# Patient Record
Sex: Male | Born: 1985 | Race: White | Hispanic: No | Marital: Married
Health system: Southern US, Community
[De-identification: ages and names within clinical notes are randomized; demographics above are authoritative.]

---

## 2019-06-06 ENCOUNTER — Emergency Department (HOSPITAL_COMMUNITY): Payer: Managed Care, Other (non HMO)

## 2019-06-06 ENCOUNTER — Other Ambulatory Visit: Payer: Self-pay

## 2019-06-06 ENCOUNTER — Emergency Department (HOSPITAL_COMMUNITY)
Admission: EM | Admit: 2019-06-06 | Discharge: 2019-06-06 | Disposition: A | Discharge status: Home / Self Care | Payer: Managed Care, Other (non HMO) | Attending: Emergency Medicine | Admitting: Emergency Medicine

## 2019-06-06 ENCOUNTER — Encounter (HOSPITAL_COMMUNITY): Payer: Self-pay | Admitting: Emergency Medicine

## 2019-06-06 DIAGNOSIS — Y9389 Activity, other specified: Secondary | ICD-10-CM | POA: Diagnosis not present

## 2019-06-06 DIAGNOSIS — R0989 Other specified symptoms and signs involving the circulatory and respiratory systems: Secondary | ICD-10-CM

## 2019-06-06 DIAGNOSIS — Z20822 Contact with and (suspected) exposure to covid-19: Secondary | ICD-10-CM | POA: Diagnosis not present

## 2019-06-06 DIAGNOSIS — Y999 Unspecified external cause status: Secondary | ICD-10-CM | POA: Diagnosis not present

## 2019-06-06 DIAGNOSIS — X58XXXA Exposure to other specified factors, initial encounter: Secondary | ICD-10-CM | POA: Diagnosis not present

## 2019-06-06 DIAGNOSIS — Y929 Unspecified place or not applicable: Secondary | ICD-10-CM | POA: Diagnosis not present

## 2019-06-06 DIAGNOSIS — S1011XA Abrasion of throat, initial encounter: Secondary | ICD-10-CM | POA: Diagnosis not present

## 2019-06-06 LAB — RESPIRATORY PANEL BY RT PCR (FLU A&B, COVID)
Influenza A by PCR: NEGATIVE
Influenza B by PCR: NEGATIVE
SARS Coronavirus 2 by RT PCR: NEGATIVE

## 2019-06-06 NOTE — Procedures (Signed)
Preop diagnosis: Foreign body sensation in throat Postop diagnosis: same Procedure: Transnasal fiberoptic laryngoscopy Surgeon: Jenne Pane Anesth: Topical with 4% lidocaine Compl: None Findings: No foreign body seen in pharynx or larynx.  Anatomy unremarkable. Description:  After discussing risks, benefits, and alternatives, the patient was placed in a seated position and the right nasal passage was sprayed with topical anesthetic.  The fiberoptic scope was passed through the right nasal passage to view the pharynx and larynx.  Findings are noted above.  The scope was then removed and he was returned to nursing care in stable condition.

## 2019-06-06 NOTE — ED Triage Notes (Signed)
Pt reports possible chicken bone stuck in throat since yesterday afternoon.

## 2019-06-06 NOTE — ED Provider Notes (Signed)
[facility] EMERGENCY DEPARTMENT Provider Note   CSN: [id number] Arrival date & time: 06/06/19  1211     History Chief Complaint  Patient presents with  . food stuck in throat    Ray Hunter is a 34 y.o. male.  HPI Patient had been eating some chicken approximately 24 hours ago.  He reports he felt a very sharp small foreign body get stuck in his throat.  He thinks it is a very thin piece of bone.  He perceives it up fairly high in the throat.  He is not having any difficulty swallowing liquids or food.  He has been able to eat and drink.  Pain was worse today and thus he became concerned that it was still lodged.  No vomiting.  No difficulty breathing.  He is otherwise been well.    History reviewed. No pertinent past medical history.  There are no problems to display for this patient.   History reviewed. No pertinent surgical history.     No family history on file.  Social History   Tobacco Use  . Smoking status: Not on file  Substance Use Topics  . Alcohol use: Not on file  . Drug use: Not on file    Home Medications Prior to Admission medications   Not on File    Allergies    Patient has no known allergies.  Review of Systems   Review of Systems 10 Systems reviewed and are negative for acute change except as noted in the HPI.  Physical Exam Updated Vital Signs BP 139/80   Pulse (!) 54   Temp 97.7 F (36.5 C) (Oral)   Resp 16   Ht 6' (1.829 m)   Wt 81.6 kg   SpO2 100%   BMI 24.41 kg/m   Physical Exam Constitutional:      Appearance: Normal appearance.  HENT:     Head: Normocephalic and atraumatic.     Nose: Nose normal.     Mouth/Throat:     Mouth: Mucous membranes are moist.     Pharynx: Oropharynx is clear.     Comments: Posterior oropharynx, mild appearance of asymmetric edema in the posterior aspect of the oropharynx.  No visible foreign body.  Airway is widely patent.  Palate, tonsils and uvula are symmetric. Eyes:     Extraocular Movements: Extraocular movements intact.  Cardiovascular:     Rate and Rhythm: Normal rate and regular rhythm.  Pulmonary:     Effort: Pulmonary effort is normal.     Breath sounds: Normal breath sounds.  Musculoskeletal:        General: Normal range of motion.     Cervical back: Neck supple.  Lymphadenopathy:     Cervical: No cervical adenopathy.  Skin:    General: Skin is warm and dry.  Neurological:     General: No focal deficit present.     Mental Status: He is alert and oriented to person, place, and time.     Coordination: Coordination normal.  Psychiatric:        Mood and Affect: Mood normal.     ED Results / Procedures / Treatments   Labs (all labs ordered are listed, but only abnormal results are displayed) Labs Reviewed  RESPIRATORY PANEL BY RT PCR (FLU A&B, COVID)    EKG None  Radiology DG Neck Soft Tissue  Result Date: 06/06/2019 CLINICAL DATA:  Chicken bone in throat. Patient reports he swallowed a chicken bone yesterday, neck discomfort. EXAM: NECK SOFT  TISSUES - 1+ VIEW COMPARISON:  None. FINDINGS: There is no evidence of retropharyngeal soft tissue swelling or epiglottic enlargement. The cervical airway is unremarkable. No pneumomediastinum. Cricoid cartilage is unremarkable. No radio-opaque foreign body identified. IMPRESSION: Negative soft tissue neck radiographs. No radiopaque foreign body or pneumomediastinum. Electronically Signed   By: Keith Rake M.D.   On: 06/06/2019 16:25   DG Chest 2 View  Result Date: 06/06/2019 CLINICAL DATA:  Chest pain.  Chicken bone stuck in throat. EXAM: CHEST - 2 VIEW COMPARISON:  None. FINDINGS: The heart size and mediastinal contours are within normal limits. No evidence of pneumomediastinum or pneumothorax. Both lungs are clear. No radiopaque foreign body identified. The visualized skeletal structures are unremarkable. IMPRESSION: Negative.  No active cardiopulmonary disease. Electronically Signed   By: Marlaine Hind M.D.   On: 06/06/2019 12:59    Procedures Procedures (including critical care time)  Medications Ordered in ED Medications - No data to display  ED Course  I have reviewed the triage vital signs and the nursing notes.  Pertinent labs & imaging results that were available during my care of the patient were reviewed by me and considered in my medical decision making (see chart for details).  Clinical Course as of Jun 07 31  Tue Jun 06, 2019  1624 Consult: Reviewed with Dr. Redmond Baseman.  He will scope at bedside.   [MP]    Clinical Course User Index [MP] Charlesetta Shanks, MD   MDM Rules/Calculators/A&P                      Patient presents as outlined.  No difficulty handling secretions.  No difficulty swallowing or breathing.  He was examined by Dr. Redmond Baseman and no foreign body identified.  Symptomatic treatment with throat lozenges and gargles.  Return precautions reviewed. Final Clinical Impression(s) / ED Diagnoses Final diagnoses:  Abrasion of throat, initial encounter  Foreign body sensation in throat    Rx / DC Orders ED Discharge Orders    None       Charlesetta Shanks, MD 06/07/19 [phone]

## 2019-06-06 NOTE — Consult Note (Signed)
Reason for Consult: Throat irritation Referring Physician: ER  Ray Hunter is an 34 y.o. male.  HPI: 34 year old male was eating chicken yesterday at lunch and felt a piece of bone go down his throat.  He kept a foreign body sensation through the rest of the day but was able to eat and drink without difficulty.  There was no blood.  Today, the foreign body sensation is more intense with a feeling of swelling so he came to the ER.  History reviewed. No pertinent past medical history.  History reviewed. No pertinent surgical history.  No family history on file.  Social History:  has no history on file for tobacco, alcohol, and drug.  Allergies: No Known Allergies  Medications: I have reviewed the patient's current medications.  Results for orders placed or performed during the hospital encounter of 06/06/19 (from the past 48 hour(s))  Respiratory Panel by RT PCR (Flu A&B, Covid) - Nasopharyngeal Swab     Status: None   Collection Time: 06/06/19  4:27 PM   Specimen: Nasopharyngeal Swab  Result Value Ref Range   SARS Coronavirus 2 by RT PCR NEGATIVE NEGATIVE    Comment: (NOTE) SARS-CoV-2 target nucleic acids are NOT DETECTED. The SARS-CoV-2 RNA is generally detectable in upper respiratoy specimens during the acute phase of infection. The lowest concentration of SARS-CoV-2 viral copies this assay can detect is 131 copies/mL. A negative result does not preclude SARS-Cov-2 infection and should not be used as the sole basis for treatment or other patient management decisions. A negative result may occur with  improper specimen collection/handling, submission of specimen other than nasopharyngeal swab, presence of viral mutation(s) within the areas targeted by this assay, and inadequate number of viral copies (<131 copies/mL). A negative result must be combined with clinical observations, patient history, and epidemiological information. The expected result is Negative. Fact Sheet for  Patients:  https://www.moore.com/ Fact Sheet for Healthcare Providers:  https://www.young.biz/ This test is not yet ap proved or cleared by the [place] FDA and  has been authorized for detection and/or diagnosis of SARS-CoV-2 by FDA under an Emergency Use Authorization (EUA). This EUA will remain  in effect (meaning this test can be used) for the duration of the COVID-19 declaration under Section 564(b)(1) of the Act, 21 U.S.C. section 360bbb-3(b)(1), unless the authorization is terminated or revoked sooner.    Influenza A by PCR NEGATIVE NEGATIVE   Influenza B by PCR NEGATIVE NEGATIVE    Comment: (NOTE) The Xpert Xpress SARS-CoV-2/FLU/RSV assay is intended as an aid in  the diagnosis of influenza from Nasopharyngeal swab specimens and  should not be used as a sole basis for treatment. Nasal washings and  aspirates are unacceptable for Xpert Xpress SARS-CoV-2/FLU/RSV  testing. Fact Sheet for Patients: https://www.moore.com/ Fact Sheet for Healthcare Providers: https://www.young.biz/ This test is not yet approved or cleared by the [place] FDA and  has been authorized for detection and/or diagnosis of SARS-CoV-2 by  FDA under an Emergency Use Authorization (EUA). This EUA will remain  in effect (meaning this test can be used) for the duration of the  Covid-19 declaration under Section 564(b)(1) of the Act, 21  U.S.C. section 360bbb-3(b)(1), unless the authorization is  terminated or revoked. Performed at [facility], [address], [place], [place] [address]     DG Neck Soft Tissue  Result Date: 06/06/2019 CLINICAL DATA:  Chicken bone in throat. Patient reports he swallowed a chicken bone yesterday, neck discomfort. EXAM: NECK SOFT  TISSUES - 1+ VIEW COMPARISON:  None. FINDINGS: There is no evidence of retropharyngeal soft tissue swelling or epiglottic enlargement. The cervical airway is  unremarkable. No pneumomediastinum. Cricoid cartilage is unremarkable. No radio-opaque foreign body identified. IMPRESSION: Negative soft tissue neck radiographs. No radiopaque foreign body or pneumomediastinum. Electronically Signed   By: Keith Rake M.D.   On: 06/06/2019 16:25   DG Chest 2 View  Result Date: 06/06/2019 CLINICAL DATA:  Chest pain.  Chicken bone stuck in throat. EXAM: CHEST - 2 VIEW COMPARISON:  None. FINDINGS: The heart size and mediastinal contours are within normal limits. No evidence of pneumomediastinum or pneumothorax. Both lungs are clear. No radiopaque foreign body identified. The visualized skeletal structures are unremarkable. IMPRESSION: Negative.  No active cardiopulmonary disease. Electronically Signed   By: Marlaine Hind M.D.   On: 06/06/2019 12:59    Review of Systems  HENT: Positive for sore throat.   All other systems reviewed and are negative.  Blood pressure 139/80, pulse (!) 54, temperature 97.7 F (36.5 C), temperature source Oral, resp. rate 16, height 6' (1.829 m), weight 81.6 kg, SpO2 100 %. Physical Exam  Constitutional: He is oriented to person, place, and time. He appears well-developed and well-nourished. No distress.  HENT:  Head: Normocephalic and atraumatic.  Right Ear: External ear normal.  Left Ear: External ear normal.  Nose: Nose normal.  Mouth/Throat: Oropharynx is clear and moist.  Eyes: Pupils are equal, round, and reactive to light. Conjunctivae and EOM are normal.  Cardiovascular: Normal rate.  Respiratory: Effort normal.  Musculoskeletal:     Cervical back: Normal range of motion and neck supple.  Neurological: He is alert and oriented to person, place, and time. No cranial nerve deficit.  Skin: Skin is warm and dry.  Psychiatric: He has a normal mood and affect. His behavior is normal. Judgment and thought content normal.    Assessment/Plan: Pharyngeal abrasion  Plain x-ray demonstrated no foreign body.  Fiberoptic exam  of the pharynx and larynx was unremarkable.  See procedure note.  He was reassured but invited to call back if the feeling is not gone in one week.  Melida Quitter 06/06/2019, 5:26 PM

## 2019-06-06 NOTE — Discharge Instructions (Addendum)
1.  Return if you develop fever, difficulty swallowing, stiff neck or other concerning symptoms. 2.  If symptoms are not improving within the next several days, follow-up with Dr. Jenne Pane for recheck. 3.  You may try salt water gargles and throat lozenges to help with discomfort.  You may also take ibuprofen as needed.

## 2021-09-29 IMAGING — DX DG NECK SOFT TISSUE
2 series · 2 of 2 positions shown · non-contrast
Comparison: None.

CLINICAL DATA: Chicken bone in throat. Patient reports he swallowed
a chicken bone yesterday, neck discomfort.

EXAM:
NECK SOFT TISSUES - 1+ VIEW

[neck ap]
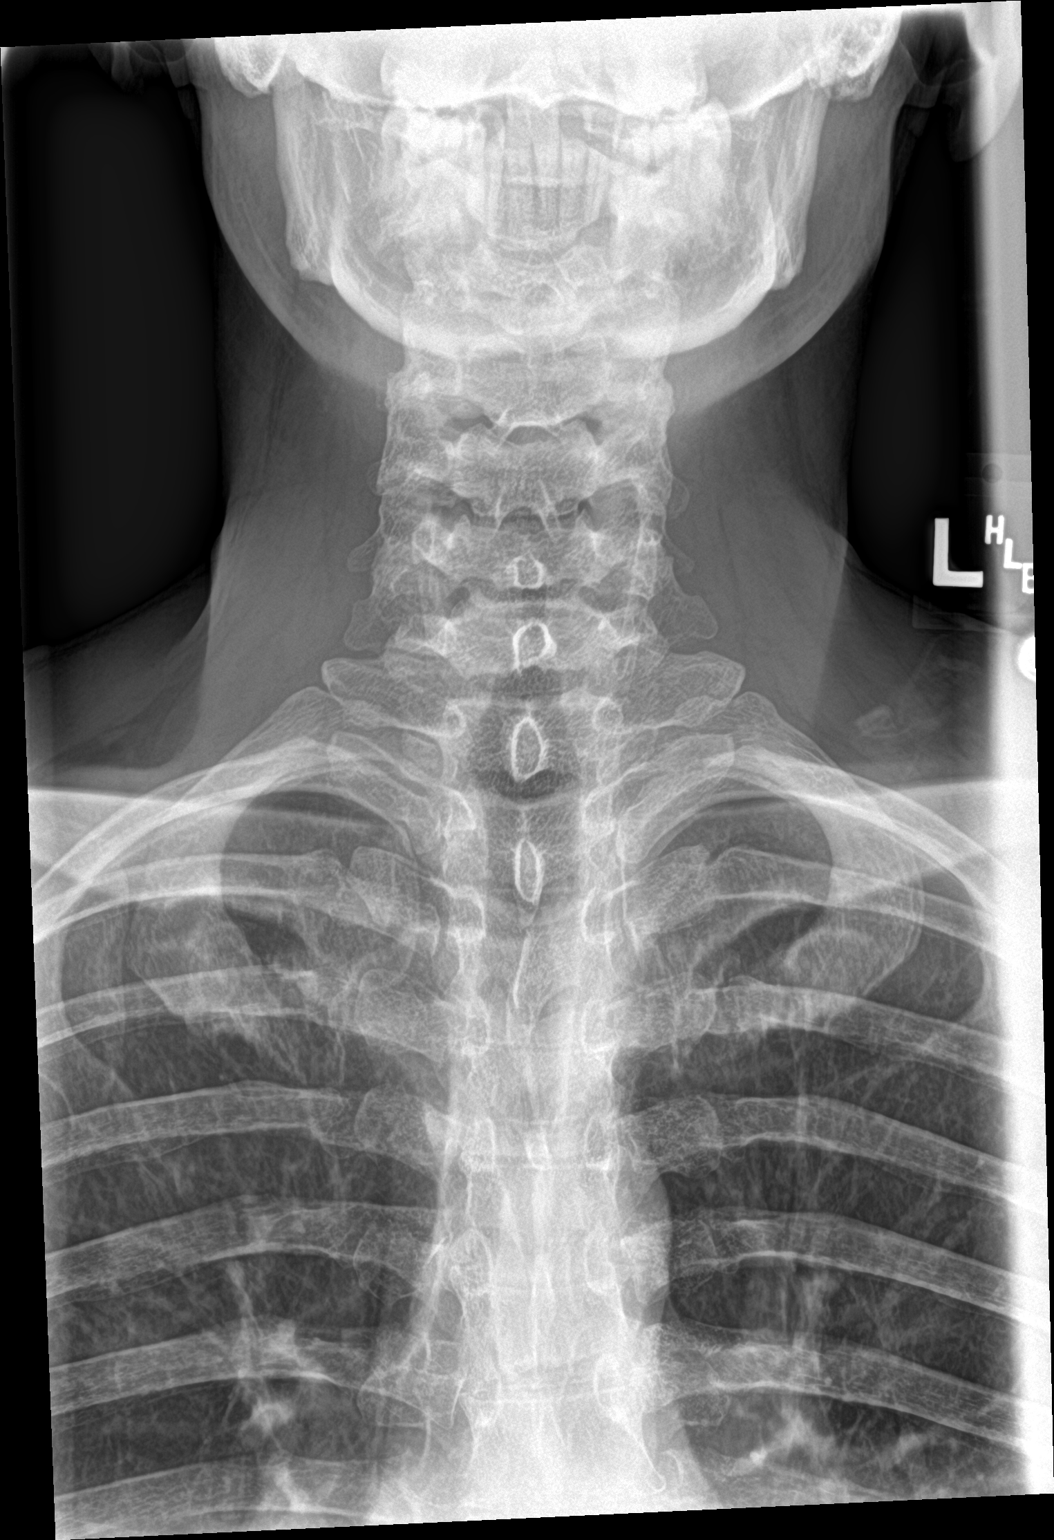

[neck lat]
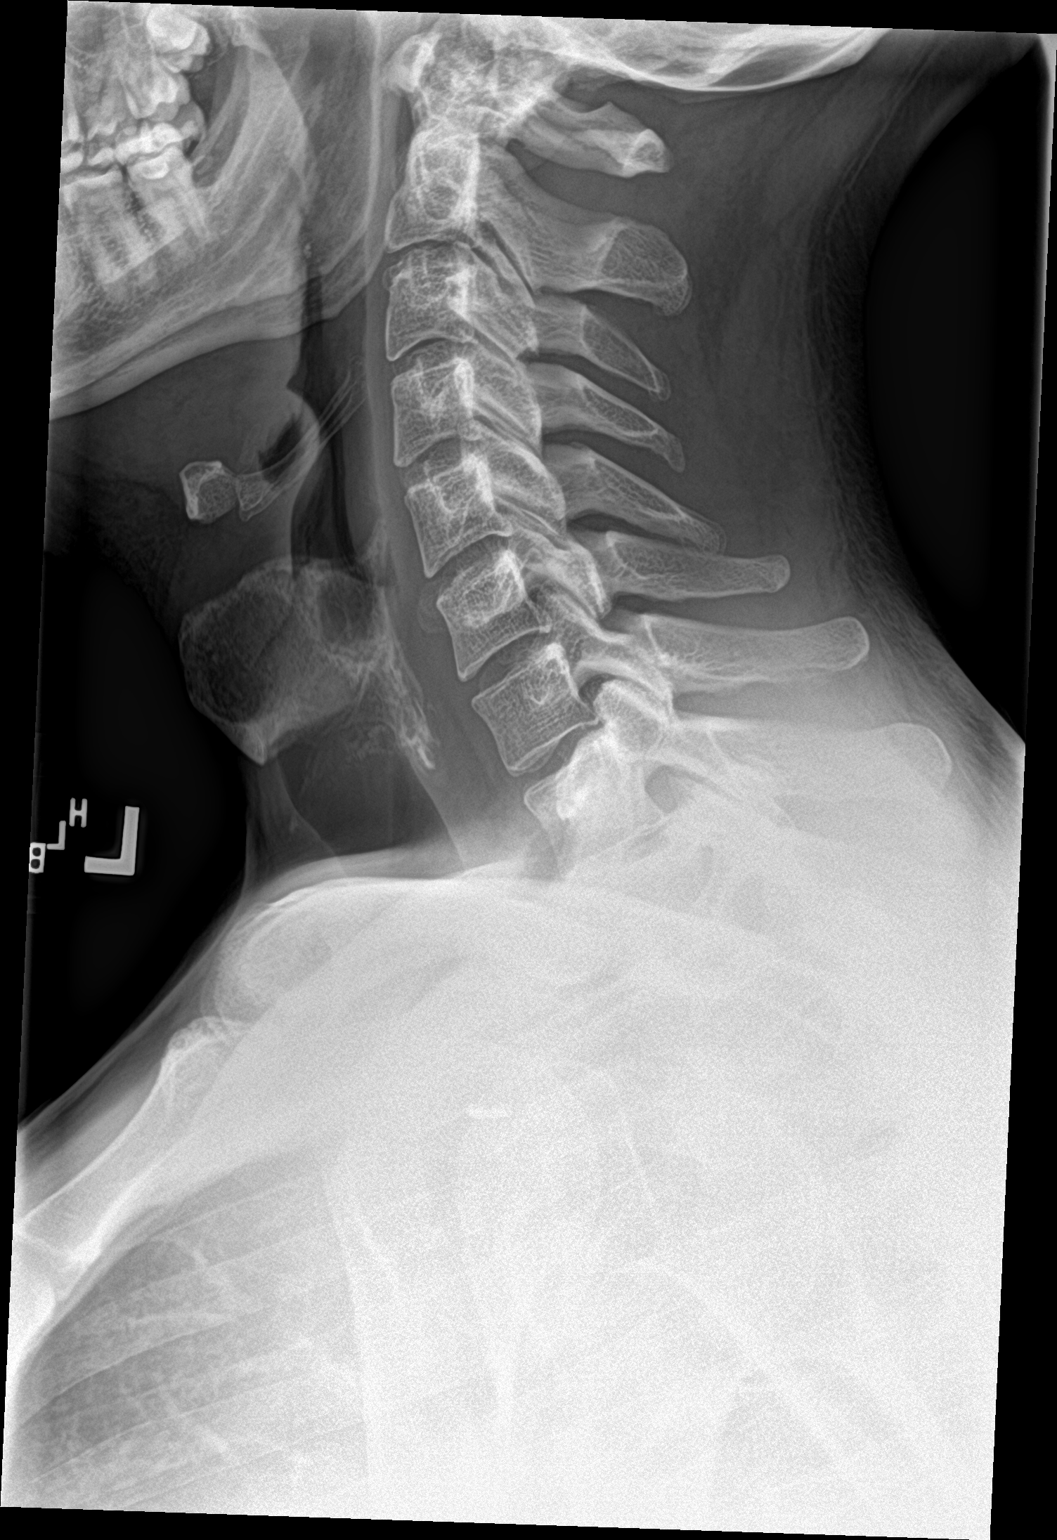

[2 of 2 positions shown; findings below may reference images not displayed]

FINDINGS: There is no evidence of retropharyngeal soft tissue swelling or
epiglottic enlargement. The cervical airway is unremarkable. No
pneumomediastinum. Cricoid cartilage is unremarkable. No
radio-opaque foreign body identified.
IMPRESSION: Negative soft tissue neck radiographs. No radiopaque foreign body or
pneumomediastinum.

## 2021-09-29 IMAGING — DX DG CHEST 2V
2 series · 2 of 2 positions shown · non-contrast
Comparison: None.

CLINICAL DATA: Chest pain.  Chicken bone stuck in throat.

EXAM:
CHEST - 2 VIEW

[chest pa]
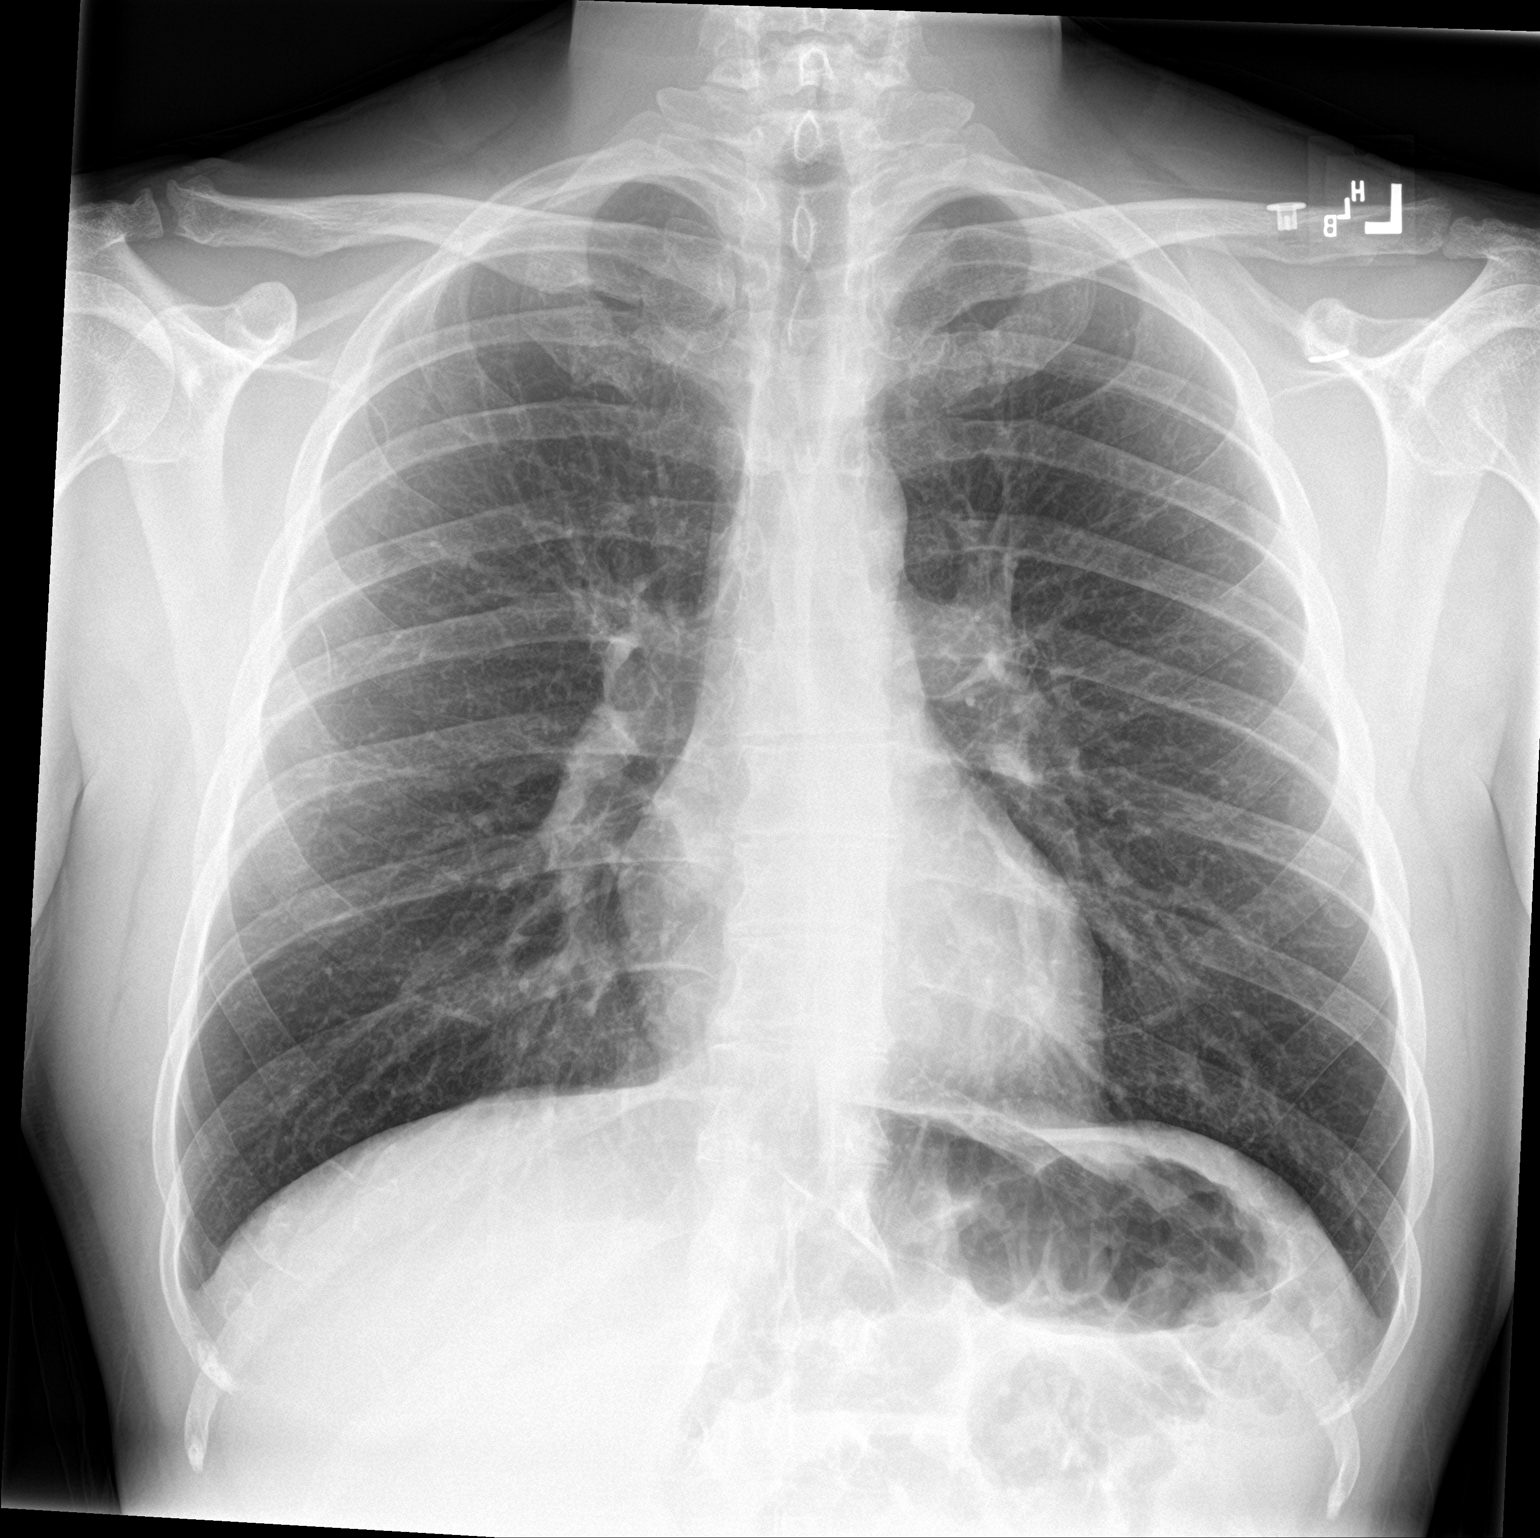

[chest lat]
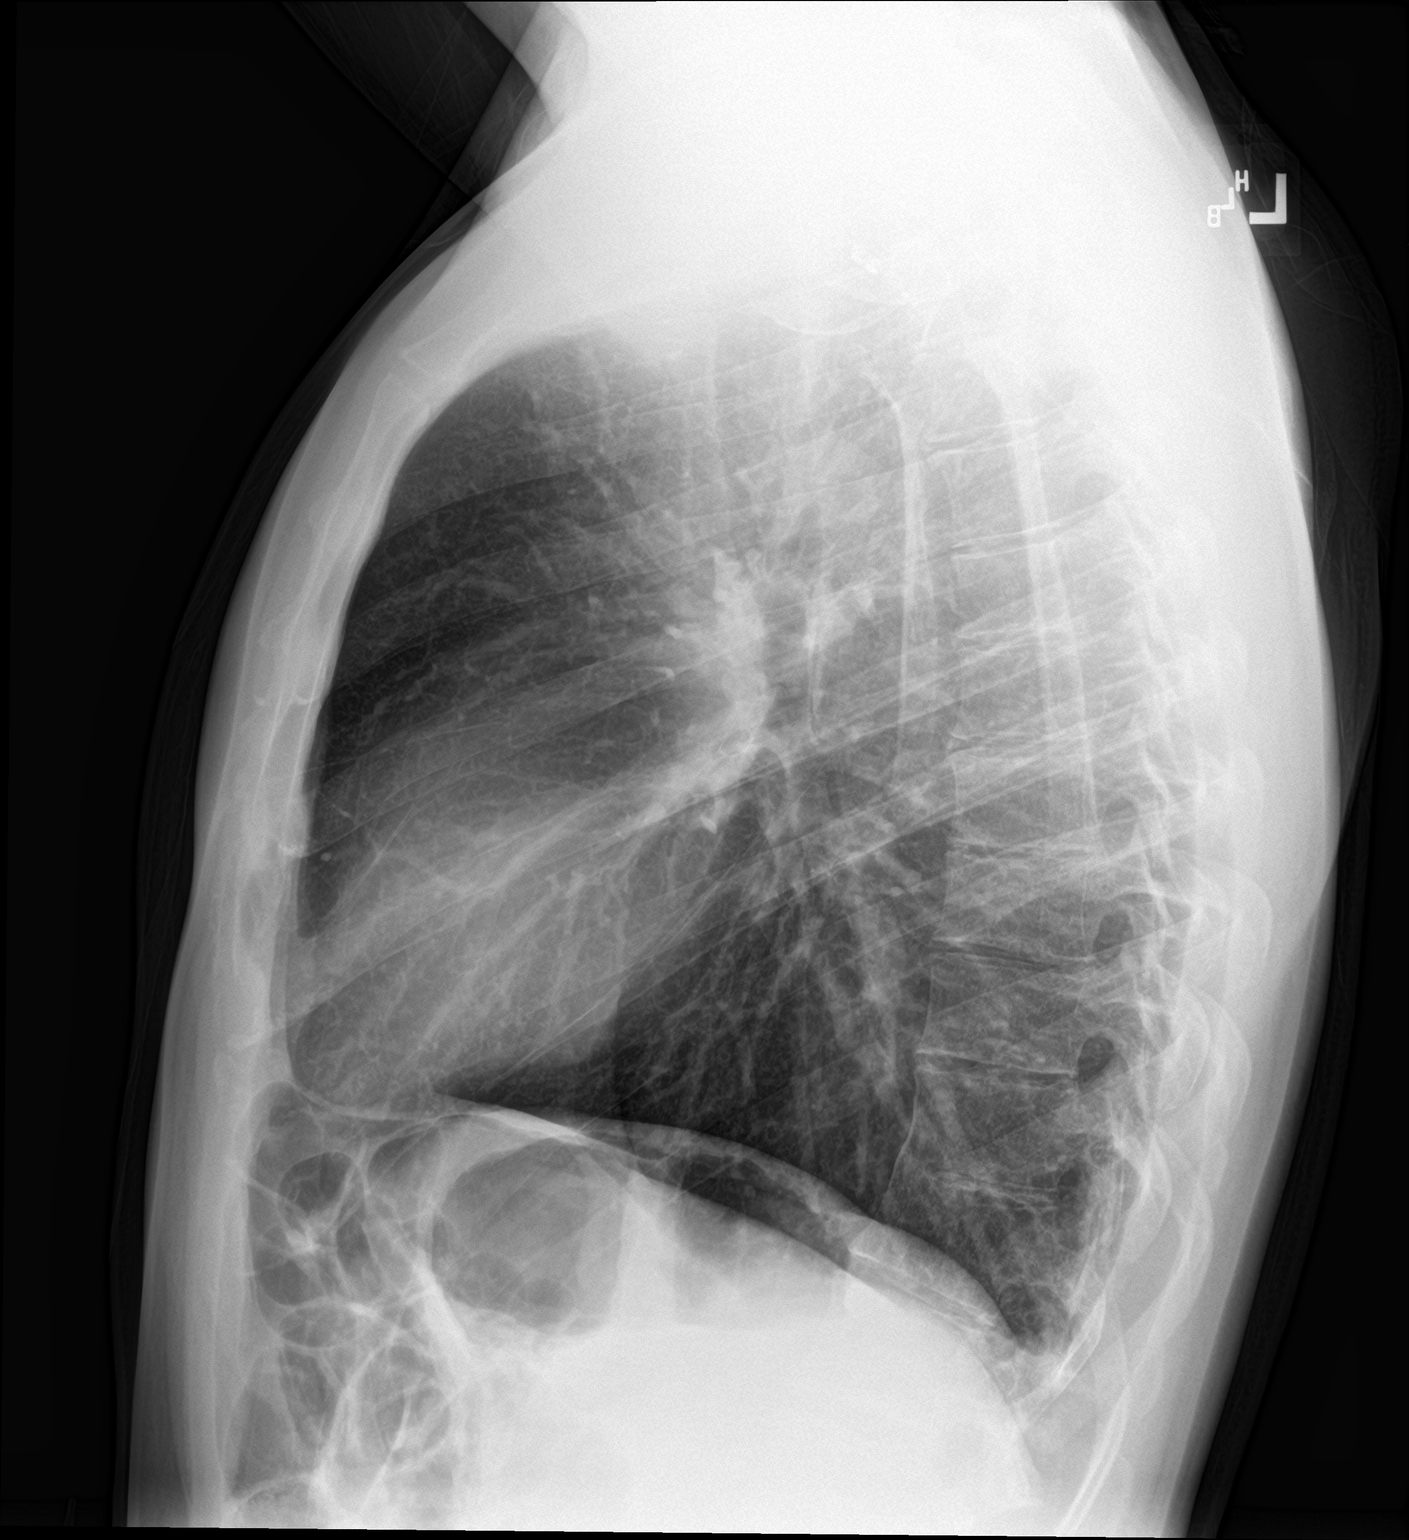

[2 of 2 positions shown; findings below may reference images not displayed]

FINDINGS: The heart size and mediastinal contours are within normal limits. No
evidence of pneumomediastinum or pneumothorax. Both lungs are clear.
No radiopaque foreign body identified. The visualized skeletal
structures are unremarkable.
IMPRESSION: Negative.  No active cardiopulmonary disease.
